# Patient Record
Sex: Male | Born: 1995 | Race: Black or African American | Hispanic: No | Marital: Single | Smoking: Current every day smoker
Health system: Southern US, Community
[De-identification: ages and names within clinical notes are randomized; demographics above are authoritative.]

## PROBLEM LIST (undated history)

## (undated) DIAGNOSIS — G43909 Migraine, unspecified, not intractable, without status migrainosus: Secondary | ICD-10-CM

## (undated) HISTORY — PX: FINGER SURGERY: SHX640

---
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
## 2015-12-20 ENCOUNTER — Encounter (HOSPITAL_COMMUNITY): Payer: Self-pay

## 2015-12-20 ENCOUNTER — Emergency Department (HOSPITAL_COMMUNITY): Payer: Self-pay

## 2015-12-20 ENCOUNTER — Emergency Department (HOSPITAL_COMMUNITY)
Admission: EM | Admit: 2015-12-20 | Discharge: 2015-12-20 | Disposition: A | Discharge status: Home / Self Care | Payer: Self-pay | Attending: Emergency Medicine | Admitting: Emergency Medicine

## 2015-12-20 DIAGNOSIS — R109 Unspecified abdominal pain: Secondary | ICD-10-CM | POA: Insufficient documentation

## 2015-12-20 DIAGNOSIS — R69 Illness, unspecified: Secondary | ICD-10-CM

## 2015-12-20 DIAGNOSIS — Z8679 Personal history of other diseases of the circulatory system: Secondary | ICD-10-CM | POA: Insufficient documentation

## 2015-12-20 DIAGNOSIS — F17210 Nicotine dependence, cigarettes, uncomplicated: Secondary | ICD-10-CM | POA: Insufficient documentation

## 2015-12-20 DIAGNOSIS — J111 Influenza due to unidentified influenza virus with other respiratory manifestations: Secondary | ICD-10-CM | POA: Insufficient documentation

## 2015-12-20 HISTORY — DX: Migraine, unspecified, not intractable, without status migrainosus: G43.909

## 2015-12-20 LAB — CBC WITH DIFFERENTIAL/PLATELET
BASOS ABS: 0 10*3/uL (ref 0.0–0.1)
BASOS PCT: 0 %
EOS PCT: 0 %
Eosinophils Absolute: 0 10*3/uL (ref 0.0–0.7)
HCT: 41 % (ref 39.0–52.0)
Hemoglobin: 13.2 g/dL (ref 13.0–17.0)
LYMPHS PCT: 11 %
Lymphs Abs: 0.9 10*3/uL (ref 0.7–4.0)
MCH: 29.7 pg (ref 26.0–34.0)
MCHC: 32.2 g/dL (ref 30.0–36.0)
MCV: 92.3 fL (ref 78.0–100.0)
MONO ABS: 0.7 10*3/uL (ref 0.1–1.0)
Monocytes Relative: 8 %
Neutro Abs: 6.7 10*3/uL (ref 1.7–7.7)
Neutrophils Relative %: 81 %
PLATELETS: 217 10*3/uL (ref 150–400)
RBC: 4.44 MIL/uL (ref 4.22–5.81)
RDW: 12.7 % (ref 11.5–15.5)
WBC: 8.3 10*3/uL (ref 4.0–10.5)

## 2015-12-20 LAB — BASIC METABOLIC PANEL
Anion gap: 10 (ref 5–15)
CALCIUM: 9.2 mg/dL (ref 8.9–10.3)
CO2: 24 mmol/L (ref 22–32)
Chloride: 104 mmol/L (ref 101–111)
Creatinine, Ser: 1.15 mg/dL (ref 0.61–1.24)
GFR calc Af Amer: >60 mL/min (ref >60)
GLUCOSE: 79 mg/dL (ref 65–99)
Potassium: 4.1 mmol/L (ref 3.5–5.1)
Sodium: 138 mmol/L (ref 135–145)

## 2015-12-20 LAB — LIPASE, BLOOD: Lipase: 23 U/L (ref 11–51)

## 2015-12-20 MED ORDER — ACETAMINOPHEN 500 MG PO TABS
1000.0000 mg | ORAL_TABLET | Freq: Once | ORAL | Status: AC
  Administered 2015-12-20: 1000 mg via ORAL
  Filled 2015-12-20: qty 2

## 2015-12-20 MED ORDER — ONDANSETRON HCL 4 MG/2ML IJ SOLN
4.0000 mg | Freq: Once | INTRAMUSCULAR | Status: AC
  Administered 2015-12-20: 4 mg via INTRAVENOUS
  Filled 2015-12-20: qty 2

## 2015-12-20 MED ORDER — SODIUM CHLORIDE 0.9 % IV BOLUS (SEPSIS)
1000.0000 mL | Freq: Once | INTRAVENOUS | Status: AC
  Administered 2015-12-20: 1000 mL via INTRAVENOUS

## 2015-12-20 MED ORDER — KETOROLAC TROMETHAMINE 30 MG/ML IJ SOLN
30.0000 mg | Freq: Once | INTRAMUSCULAR | Status: AC
  Administered 2015-12-20: 30 mg via INTRAVENOUS
  Filled 2015-12-20: qty 1

## 2015-12-20 NOTE — ED Notes (Signed)
Gave pt orange juice and crackers and a small happy meal.

## 2015-12-20 NOTE — ED Notes (Signed)
Patient transported to X-ray 

## 2015-12-20 NOTE — ED Provider Notes (Signed)
CSN: [id number]     Arrival date & time 12/20/15  1451 History   First MD Initiated Contact with Patient 12/20/15 1705     Chief Complaint  Patient presents with  . Cough  . Emesis  . Headache     (Consider location/radiation/quality/duration/timing/severity/associated sxs/prior Treatment) HPI Patient presents with concern of weakness, cough, nausea, vomiting. Symptoms began about 24 hours ago. Since onset symptoms of been persistent, with possibly 3 episodes of vomiting. No diarrhea. There is associated anorexia, weakness, abdominal discomfort, though no focal pain anywhere in the abdomen. Patient complains of diffuse myalgias as well. Patient was well prior to the onset of symptoms, denies medical problems, chronic medical conditions. Patient does acknowledge smoking cigarettes. Since onset, no relief with OTC medication, and the patient vomited his most recent attempt to take ibuprofen.  Smoking cessation provided, particularly in light of this patient's evaluation in the ED.   Past Medical History  Diagnosis Date  . Migraine    History reviewed. No pertinent past surgical history. History reviewed. No pertinent family history. Social History  Substance Use Topics  . Smoking status: Current Every Day Smoker -- 0.50 packs/day    Types: Cigarettes  . Smokeless tobacco: None  . Alcohol Use: No    Review of Systems  Constitutional:       Per HPI, otherwise negative  HENT:       Per HPI, otherwise negative  Respiratory:       Per HPI, otherwise negative  Cardiovascular:       Per HPI, otherwise negative  Gastrointestinal: Positive for vomiting.  Endocrine:       Negative aside from HPI  Genitourinary:       Neg aside from HPI   Musculoskeletal:       Per HPI, otherwise negative  Skin: Negative.   Neurological: Negative for syncope.      Allergies  Review of patient's allergies indicates no known allergies.  Home Medications   Prior to Admission  medications   Medication Sig Start Date End Date Taking? Authorizing Provider  ibuprofen (ADVIL,MOTRIN) 200 MG tablet Take 1,600 mg by mouth once.   Yes Historical Provider, MD  OVER THE COUNTER MEDICATION Take 0.5 Doses by mouth once. mucinex flu   Yes Historical Provider, MD  diphenhydrAMINE (BENADRYL) 25 MG tablet Take 1 tablet (25 mg total) by mouth every 4 (four) hours. Patient not taking: Reported on 12/20/2015 08/07/12   Margarita Mail, PA-C  hydrocortisone cream 1 % Apply to affected area every 4  Hours Patient not taking: Reported on 12/20/2015 08/07/12   Margarita Mail, PA-C   BP 108/51 mmHg  Pulse 58  Temp(Src) 100 F (37.8 C) (Oral)  Resp 18  Ht 6' (1.829 m)  Wt 175 lb 4.8 oz (79.516 kg)  BMI 23.77 kg/m2  SpO2 98% Physical Exam  Constitutional: He is oriented to person, place, and time. He appears well-developed. No distress.  HENT:  Head: Normocephalic and atraumatic.  Eyes: Conjunctivae and EOM are normal.  Cardiovascular: Normal rate and regular rhythm.   Pulmonary/Chest: Effort normal. No stridor. No respiratory distress.  Abdominal: He exhibits no distension. There is no tenderness.  Musculoskeletal: He exhibits no edema.  Neurological: He is alert and oriented to person, place, and time.  Skin: Skin is warm and dry.  Psychiatric: He has a normal mood and affect.  Nursing note and vitals reviewed.   ED Course  Procedures (including critical care time) Labs Review Labs Reviewed  BASIC METABOLIC  PANEL - Abnormal; Notable for the following:    BUN <5 (*)    All other components within normal limits  CBC WITH DIFFERENTIAL/PLATELET  LIPASE, BLOOD    Imaging Review Dg Chest 2 View  12/20/2015  CLINICAL DATA:  Cough and fever for 1 day EXAM: CHEST  2 VIEW COMPARISON:  None. FINDINGS: The heart size and mediastinal contours are within normal limits. Both lungs are clear. The visualized skeletal structures are unremarkable. IMPRESSION: No active cardiopulmonary  disease. Electronically Signed   By: Inez Catalina M.D.   On: 12/20/2015 18:45   I have personally reviewed and evaluated these images and lab results as part of my medical decision-making.  On repeat exam the patient is in no distress, states that he feels better. We discussed all findings, likelihood of influenza-like illness.   MDM   Final diagnoses:  Influenza-like illness   Well-appearing young male presents with generalized illness, cough, congestion. Here, the patient is awake and alert, with no evidence for bacteremia, sepsis. No evidence for pneumonia. Patient improved here with fluids, vacation. Patient's presentation consistent with influenza-like illness, no indication for admission, and with his improvement, he was discharged in stable condition.  Carmin Muskrat, MD 12/21/15 [phone]

## 2015-12-20 NOTE — Discharge Instructions (Signed)
Influenza-like illnesses Influenza ("the flu") is a viral infection of the respiratory tract. It occurs more often in winter months because people spend more time in close contact with one another. Influenza can make you feel very sick. Influenza easily spreads from person to person (contagious). CAUSES  Influenza is caused by a virus that infects the respiratory tract. You can catch the virus by breathing in droplets from an infected person's cough or sneeze. You can also catch the virus by touching something that was recently contaminated with the virus and then touching your mouth, nose, or eyes. RISKS AND COMPLICATIONS You may be at risk for a more severe case of influenza if you smoke cigarettes, have diabetes, have chronic heart disease (such as heart failure) or lung disease (such as asthma), or if you have a weakened immune system. Elderly people and pregnant women are also at risk for more serious infections. The most common problem of influenza is a lung infection (pneumonia). Sometimes, this problem can require emergency medical care and may be life threatening. SIGNS AND SYMPTOMS  Symptoms typically last 4 to 10 days and may include:  Fever.  Chills.  Headache, body aches, and muscle aches.  Sore throat.  Chest discomfort and cough.  Poor appetite.  Weakness or feeling tired.  Dizziness.  Nausea or vomiting. DIAGNOSIS  Diagnosis of influenza is often made based on your history and a physical exam. A nose or throat swab test can be done to confirm the diagnosis. TREATMENT  In mild cases, influenza goes away on its own. Treatment is directed at relieving symptoms. For more severe cases, your health care provider may prescribe antiviral medicines to shorten the sickness. Antibiotic medicines are not effective because the infection is caused by a virus, not by bacteria. HOME CARE INSTRUCTIONS  Take medicines only as directed by your health care provider.  Use a cool mist  humidifier to make breathing easier.  Get plenty of rest until your temperature returns to normal. This usually takes 3 to 4 days.  Drink enough fluid to keep your urine clear or pale yellow.  Cover yourmouth and nosewhen coughing or sneezing,and wash your handswellto prevent thevirusfrom spreading.  Stay homefromwork orschool untilthe fever is gonefor at least 64full day. PREVENTION  An annual influenza vaccination (flu shot) is the best way to avoid getting influenza. An annual flu shot is now routinely recommended for all adults in the [place] IF:  You experiencechest pain, yourcough worsens,or you producemore mucus.  Youhave nausea,vomiting, ordiarrhea.  Your fever returns or gets worse. SEEK IMMEDIATE MEDICAL CARE IF:  You havetrouble breathing, you become short of breath,or your skin ornails becomebluish.  You have severe painor stiffnessin the neck.  You develop a sudden headache, or pain in the face or ear.  You have nausea or vomiting that you cannot control. MAKE SURE YOU:   Understand these instructions.  Will watch your condition.  Will get help right away if you are not doing well or get worse.   This information is not intended to replace advice given to you by your health care provider. Make sure you discuss any questions you have with your health care provider.   Document Released: 09/09/2000 Document Revised: 10/03/2014 Document Reviewed: 12/12/2011 Elsevier Interactive Patient Education [employer].

## 2015-12-20 NOTE — ED Notes (Signed)
Onset last night headache, woke up this morning with body aches, vomiting x 3 and cough.  No respiratory difficulties, talking in complete sentences.  Took Ibu 800 x 2 @ 1:30pm.

## 2017-09-05 IMAGING — CR DG CHEST 2V
2 series · 2 of 2 positions shown · non-contrast
Comparison: None.

CLINICAL DATA: Cough and fever for 1 day

EXAM:
CHEST  2 VIEW

[chest pa]
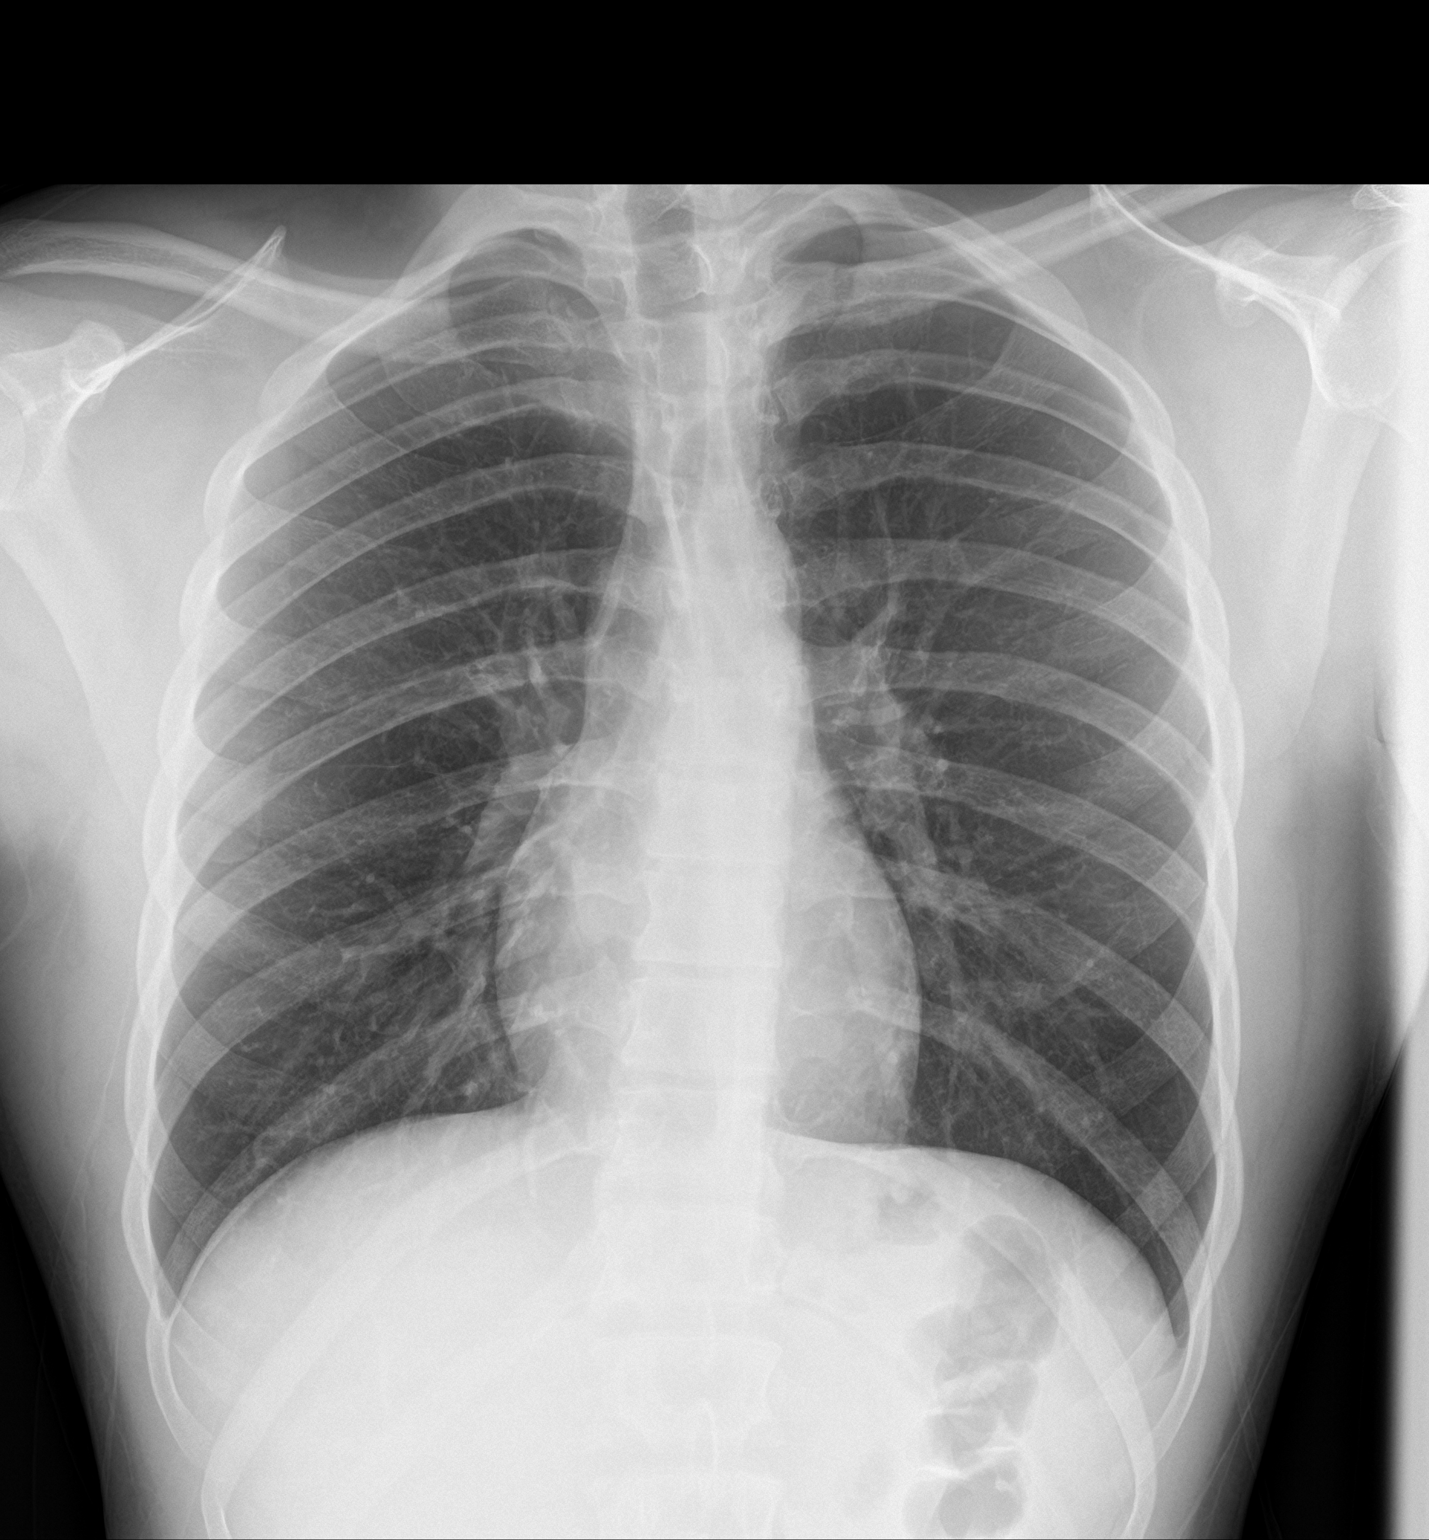

[chest lat]
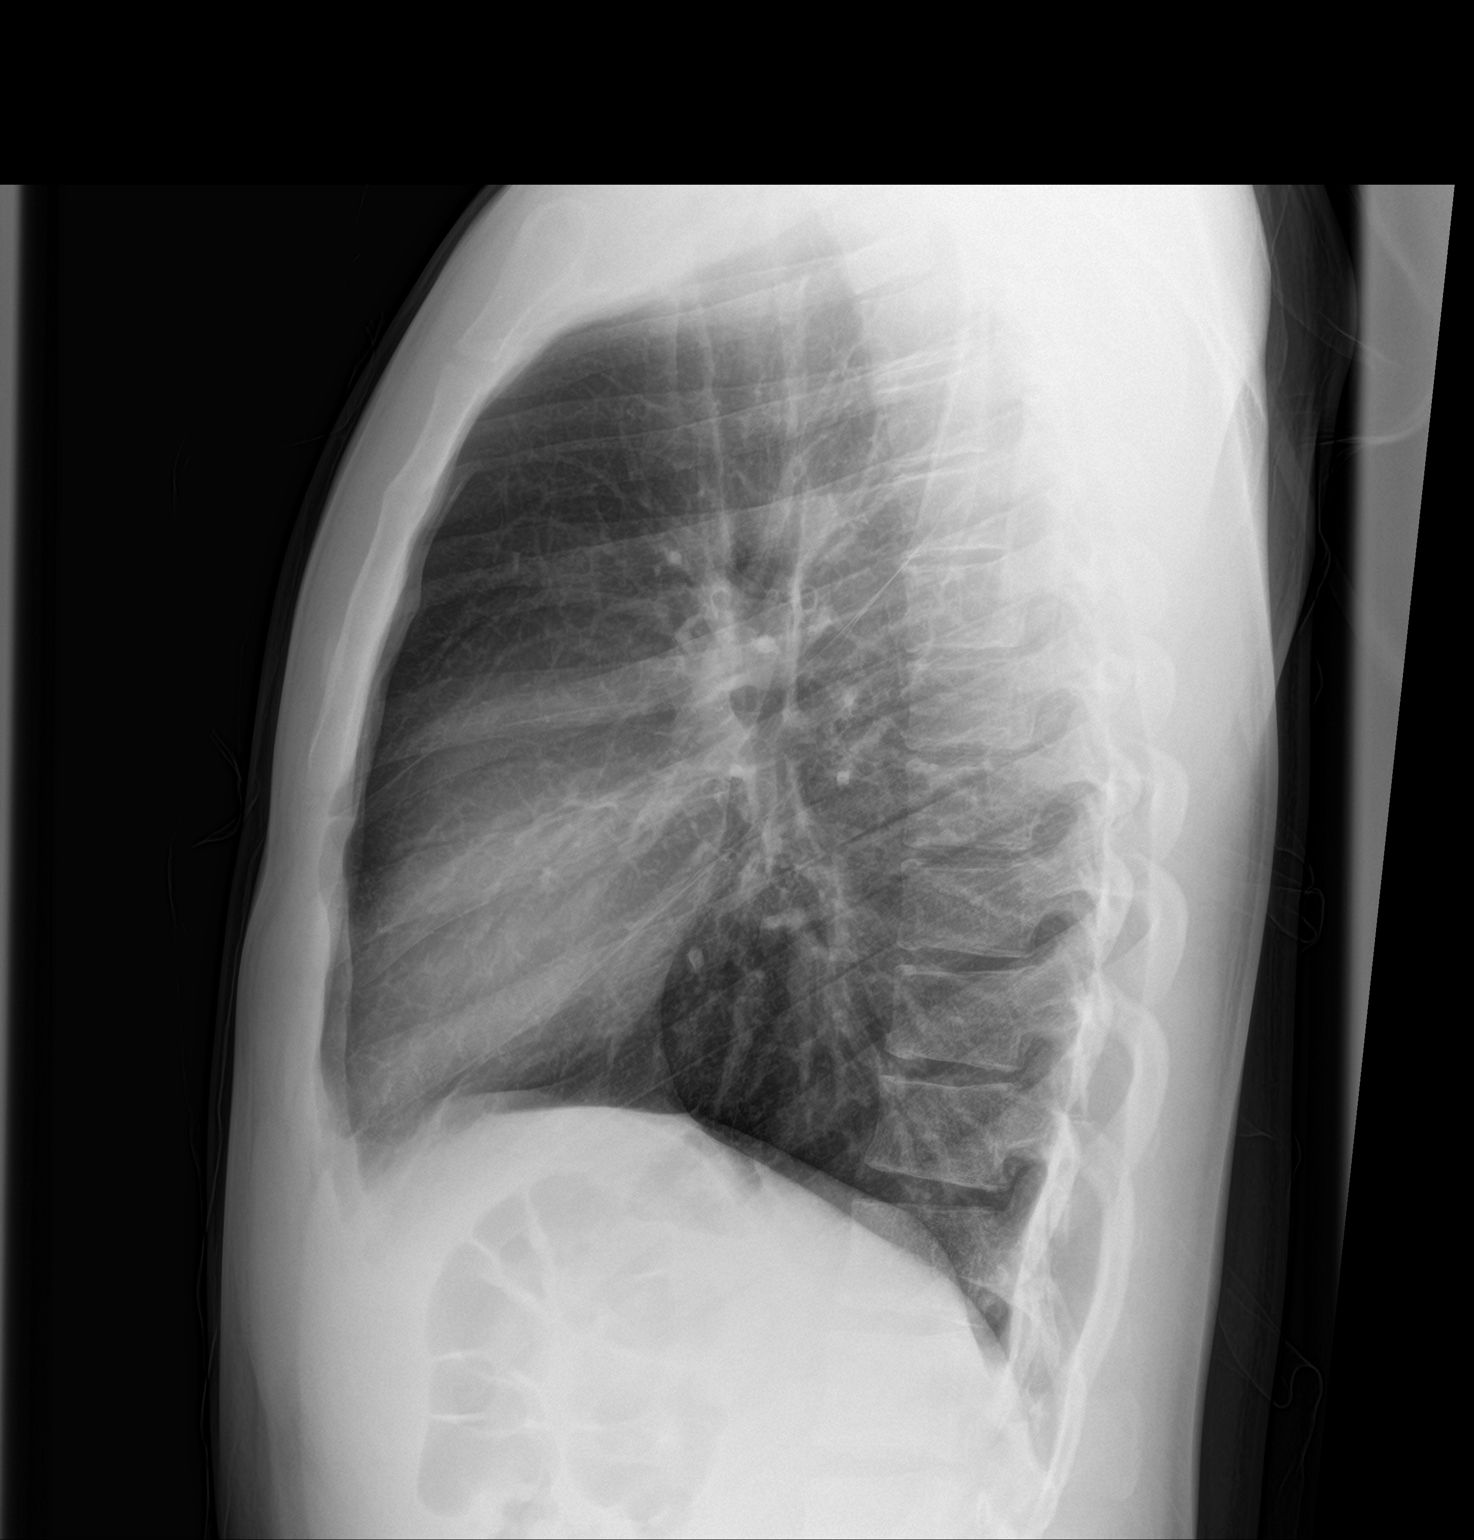

[2 of 2 positions shown; findings below may reference images not displayed]

FINDINGS: The heart size and mediastinal contours are within normal limits.
Both lungs are clear. The visualized skeletal structures are
unremarkable.
IMPRESSION: No active cardiopulmonary disease.

## 2022-02-22 ENCOUNTER — Other Ambulatory Visit: Payer: Self-pay

## 2022-02-22 ENCOUNTER — Emergency Department (HOSPITAL_COMMUNITY)
Admission: EM | Admit: 2022-02-22 | Discharge: 2022-02-22 | Disposition: A | Discharge status: Home / Self Care | Payer: Self-pay | Attending: Emergency Medicine | Admitting: Emergency Medicine

## 2022-02-22 ENCOUNTER — Encounter (HOSPITAL_COMMUNITY): Payer: Self-pay

## 2022-02-22 DIAGNOSIS — Z202 Contact with and (suspected) exposure to infections with a predominantly sexual mode of transmission: Secondary | ICD-10-CM | POA: Insufficient documentation

## 2022-02-22 DIAGNOSIS — R030 Elevated blood-pressure reading, without diagnosis of hypertension: Secondary | ICD-10-CM | POA: Insufficient documentation

## 2022-02-22 MED ORDER — METRONIDAZOLE 500 MG PO TABS: 500.0000 mg | ORAL_TABLET | Freq: Two times a day (BID) | ORAL | 0 refills | Status: AC

## 2022-02-22 NOTE — Discharge Instructions (Signed)
It was our pleasure to provide your ER care today - we hope that you feel better.  See 'safer sex' info provided.   Take antibiotic as prescribed.  Follow up with [facility] if any other concern.   Your blood pressure is high today - follow up with primary care doctor in the next couple weeks for recheck.

## 2022-02-22 NOTE — ED Triage Notes (Signed)
Patient reports that his sexual partner told him that she was positive for trichomonas.

## 2022-02-22 NOTE — ED Provider Notes (Signed)
[facility] DEPT Provider Note   CSN: [id number] Arrival date & time: 02/22/22  1707     History  Chief Complaint  Patient presents with   SEXUALLY TRANSMITTED DISEASE    Anthony Hooper is a 26 y.o. male.  Patient indicates a sexual contact told him she tested positive for trich and he wants tx for that. Pt denies any symptoms. No penile discharge. No dysuria. No skin or penile ulcers, lesions or rash. No fevers.   The history is provided by the patient and medical records.      Home Medications Prior to Admission medications   Medication Sig Start Date End Date Taking? Authorizing Provider  diphenhydrAMINE (BENADRYL) 25 MG tablet Take 1 tablet (25 mg total) by mouth every 4 (four) hours. Patient not taking: Reported on 12/20/2015 08/07/12   Margarita Mail, PA-C  hydrocortisone cream 1 % Apply to affected area every 4  Hours Patient not taking: Reported on 12/20/2015 08/07/12   Margarita Mail, PA-C  ibuprofen (ADVIL,MOTRIN) 200 MG tablet Take 1,600 mg by mouth once.    [provider]  OVER THE COUNTER MEDICATION Take 0.5 Doses by mouth once. mucinex flu    [provider]      Allergies    Patient has no known allergies.    Review of Systems   Review of Systems  Constitutional:  Negative for fever.  HENT:  Negative for sore throat.   Gastrointestinal:  Negative for abdominal pain.  Genitourinary:  Negative for dysuria, penile discharge and penile pain.  Musculoskeletal:  Negative for arthralgias.  Skin:  Negative for rash and wound.   Physical Exam Updated Vital Signs BP (!) 149/77 (BP Location: Left Arm)   Pulse 74   Temp 98.1 F (36.7 C) (Oral)   Resp 18   Ht 1.829 m (6')   Wt 90.7 kg   SpO2 100%   BMI 27.12 kg/m  Physical Exam Vitals and nursing note reviewed.  Constitutional:      Appearance: Normal appearance. He is well-developed.  HENT:     Head: Atraumatic.     Nose: Nose normal.      Mouth/Throat:     Mouth: Mucous membranes are moist.  Eyes:     Conjunctiva/sclera: Conjunctivae normal.  Neck:     Trachea: No tracheal deviation.  Cardiovascular:     Rate and Rhythm: Normal rate.     Heart sounds:    No gallop.  Pulmonary:     Effort: Pulmonary effort is normal. No accessory muscle usage or respiratory distress.  Abdominal:     General: There is no distension.     Tenderness: There is no abdominal tenderness.  Genitourinary:    Comments: No penile d/c.  Musculoskeletal:        General: No swelling.     Cervical back: Neck supple. No rigidity.  Skin:    General: Skin is warm and dry.     Findings: No rash.  Neurological:     Mental Status: He is alert.     Comments: Alert, speech clear.   Psychiatric:        Mood and Affect: Mood normal.    ED Results / Procedures / Treatments   Labs (all labs ordered are listed, but only abnormal results are displayed) Labs Reviewed - No data to display  EKG None  Radiology No results found.  Procedures Procedures    Medications Ordered in ED Medications - No data to display  ED Course/ Medical Decision Making/ A&P                           Medical Decision Making Problems Addressed: Elevated blood pressure reading: acute illness or injury that poses a threat to life or bodily functions Possible exposure to STD: acute illness or injury  Risk Prescription drug management.   Pt denies any current symptoms, and is requesting rx for trichomonas.   Confirmed nkda w pt.   Rx provided.   Also discussed elevated bp and need for outpatient f/u.  Return precautions provided.         Final Clinical Impression(s) / ED Diagnoses Final diagnoses:  None    Rx / DC Orders ED Discharge Orders     None         Lajean Saver, MD 02/22/22 1747

## 2023-05-10 ENCOUNTER — Other Ambulatory Visit: Payer: Self-pay

## 2023-05-10 ENCOUNTER — Emergency Department (HOSPITAL_BASED_OUTPATIENT_CLINIC_OR_DEPARTMENT_OTHER): Payer: BC Managed Care – PPO

## 2023-05-10 ENCOUNTER — Encounter (HOSPITAL_BASED_OUTPATIENT_CLINIC_OR_DEPARTMENT_OTHER): Payer: Self-pay | Admitting: Emergency Medicine

## 2023-05-10 ENCOUNTER — Emergency Department (HOSPITAL_BASED_OUTPATIENT_CLINIC_OR_DEPARTMENT_OTHER)
Admission: EM | Admit: 2023-05-10 | Discharge: 2023-05-10 | Disposition: A | Discharge status: Home / Self Care | Payer: BC Managed Care – PPO | Attending: Emergency Medicine | Admitting: Emergency Medicine

## 2023-05-10 DIAGNOSIS — R519 Headache, unspecified: Secondary | ICD-10-CM | POA: Diagnosis present

## 2023-05-10 DIAGNOSIS — Z8249 Family history of ischemic heart disease and other diseases of the circulatory system: Secondary | ICD-10-CM

## 2023-05-10 LAB — CBC
HCT: 43.2 % (ref 39.0–52.0)
Hemoglobin: 14.8 g/dL (ref 13.0–17.0)
MCH: 31.2 pg (ref 26.0–34.0)
MCHC: 34.3 g/dL (ref 30.0–36.0)
MCV: 90.9 fL (ref 80.0–100.0)
Platelets: 302 10*3/uL (ref 150–400)
RBC: 4.75 MIL/uL (ref 4.22–5.81)
RDW: 12.8 % (ref 11.5–15.5)
WBC: 8.5 10*3/uL (ref 4.0–10.5)
nRBC: 0 % (ref 0.0–0.2)

## 2023-05-10 LAB — BASIC METABOLIC PANEL
Anion gap: 9 (ref 5–15)
BUN: 12 mg/dL (ref 6–20)
CO2: 25 mmol/L (ref 22–32)
Calcium: 9.1 mg/dL (ref 8.9–10.3)
Chloride: 103 mmol/L (ref 98–111)
Creatinine, Ser: 1.13 mg/dL (ref 0.61–1.24)
GFR, Estimated: >60 mL/min (ref >60)
Glucose, Bld: 78 mg/dL (ref 70–99)
Potassium: 3.8 mmol/L (ref 3.5–5.1)
Sodium: 137 mmol/L (ref 135–145)

## 2023-05-10 MED ORDER — SODIUM CHLORIDE 0.9 % IV BOLUS
500.0000 mL | Freq: Once | INTRAVENOUS | Status: AC
  Administered 2023-05-10: 500 mL via INTRAVENOUS

## 2023-05-10 MED ORDER — IOHEXOL 350 MG/ML SOLN
75.0000 mL | Freq: Once | INTRAVENOUS | Status: AC | PRN
  Administered 2023-05-10: 75 mL via INTRAVENOUS

## 2023-05-10 MED ORDER — PROCHLORPERAZINE EDISYLATE 10 MG/2ML IJ SOLN
10.0000 mg | Freq: Once | INTRAMUSCULAR | Status: AC
  Administered 2023-05-10: 10 mg via INTRAVENOUS
  Filled 2023-05-10: qty 2

## 2023-05-10 MED ORDER — DIPHENHYDRAMINE HCL 50 MG/ML IJ SOLN
12.5000 mg | Freq: Once | INTRAMUSCULAR | Status: AC
  Administered 2023-05-10: 12.5 mg via INTRAVENOUS
  Filled 2023-05-10: qty 1

## 2023-05-10 MED ORDER — KETOROLAC TROMETHAMINE 30 MG/ML IJ SOLN
30.0000 mg | Freq: Once | INTRAMUSCULAR | Status: AC
  Administered 2023-05-10: 30 mg via INTRAVENOUS
  Filled 2023-05-10: qty 1

## 2023-05-10 NOTE — ED Provider Notes (Signed)
[facility] Provider Note   CSN: [id number] Arrival date & time: 05/10/23  1504     History  Chief Complaint  Patient presents with   Headache    Anthony Hooper is a 27 y.o. male with history of migraines, not on preventative medications, who presents to the emergency department complaining of a headache.  Patient states that he has had a headache for the past 5 to 6 days, mainly with pain behind both of his eyes.  While he states that this feels like his previous headaches, this has lasted much longer.  Normally he can take some ibuprofen and his headache resolved the following day.  Having some mild photophobia, no blurry vision.  Denies fever, cough, congestion, neck stiffness, vomiting.  No known sick contacts.  He does endorse a significant family history of brain aneurysms, which his mother required surgery for.   Headache Associated symptoms: photophobia        Home Medications Prior to Admission medications   Medication Sig Start Date End Date Taking? Authorizing Provider  diphenhydrAMINE (BENADRYL) 25 MG tablet Take 1 tablet (25 mg total) by mouth every 4 (four) hours. Patient not taking: Reported on 12/20/2015 08/07/12   Arthor Captain, PA-C  hydrocortisone cream 1 % Apply to affected area every 4  Hours Patient not taking: Reported on 12/20/2015 08/07/12   Arthor Captain, PA-C  ibuprofen (ADVIL,MOTRIN) 200 MG tablet Take 1,600 mg by mouth once.    [provider]  metroNIDAZOLE (FLAGYL) 500 MG tablet Take 1 tablet (500 mg total) by mouth 2 (two) times daily. Do not drink alcohol when taking this medication 02/22/22   Cathren Laine, MD  OVER THE COUNTER MEDICATION Take 0.5 Doses by mouth once. mucinex flu    [provider]      Allergies    Patient has no known allergies.    Review of Systems   Review of Systems  Eyes:  Positive for photophobia.  Neurological:  Positive for headaches.  All other  systems reviewed and are negative.   Physical Exam Updated Vital Signs BP 123/80 (BP Location: Right Arm)   Pulse 92   Temp 97.8 F (36.6 C)   Resp 18   Ht 6' (1.829 m)   Wt 95.3 kg   SpO2 96%   BMI 28.48 kg/m  Physical Exam Vitals and nursing note reviewed.  Constitutional:      Appearance: Normal appearance.  HENT:     Head: Normocephalic and atraumatic.  Eyes:     General: Lids are normal.     Extraocular Movements: Extraocular movements intact.     Conjunctiva/sclera: Conjunctivae normal.     Pupils: Pupils are equal, round, and reactive to light.  Neck:     Meningeal: Brudzinski's sign and Kernig's sign absent.  Pulmonary:     Effort: Pulmonary effort is normal. No respiratory distress.  Musculoskeletal:     Cervical back: Full passive range of motion without pain and normal range of motion. No rigidity. No spinous process tenderness or muscular tenderness.  Skin:    General: Skin is warm and dry.  Neurological:     Mental Status: He is alert.     Comments: Neuro: Speech is clear, able to follow commands. CN III-XII intact grossly intact. PERRLA. EOMI. Sensation intact throughout. Str 5/5 all extremities.  Psychiatric:        Mood and Affect: Mood normal.        Behavior: Behavior  normal.     ED Results / Procedures / Treatments   Labs (all labs ordered are listed, but only abnormal results are displayed) Labs Reviewed  CBC  BASIC METABOLIC PANEL    EKG None  Radiology CT Angio Head Neck W WO CM  Result Date: 05/10/2023 CLINICAL DATA:  Initial evaluation for acute headache. EXAM: CT ANGIOGRAPHY HEAD AND NECK WITH AND WITHOUT CONTRAST TECHNIQUE: Multidetector CT imaging of the head and neck was performed using the standard protocol during bolus administration of intravenous contrast. Multiplanar CT image reconstructions and MIPs were obtained to evaluate the vascular anatomy. Carotid stenosis measurements (when applicable) are obtained utilizing NASCET  criteria, using the distal internal carotid diameter as the denominator. RADIATION DOSE REDUCTION: This exam was performed according to the departmental dose-optimization program which includes automated exposure control, adjustment of the mA and/or kV according to patient size and/or use of iterative reconstruction technique. CONTRAST:  75mL OMNIPAQUE IOHEXOL 350 MG/ML SOLN COMPARISON:  None Available. FINDINGS: CT HEAD FINDINGS Brain: Cerebral volume within normal limits for patient age. No evidence for acute intracranial hemorrhage. No findings to suggest acute large vessel territory infarct. No mass lesion, midline shift, or mass effect. Ventricles are normal in size without evidence for hydrocephalus. No extra-axial fluid collection identified. Vascular: No hyperdense vessel identified. Skull: Scalp soft tissues demonstrate no acute abnormality. Calvarium intact. Sinuses/Orbits: Globes and orbital soft tissues within normal limits. Few scattered retention cyst noted about the maxillary sinuses. Paranasal sinuses are otherwise clear. No mastoid effusion. CTA NECK FINDINGS Aortic arch: Standard branching. Imaged portion shows no evidence of aneurysm or dissection. No significant stenosis of the major arch vessel origins. Right carotid system: No evidence of dissection, stenosis (50% or greater), or occlusion. Left carotid system: No evidence of dissection, stenosis (50% or greater), or occlusion. Vertebral arteries: Codominant. No evidence of dissection, stenosis (50% or greater), or occlusion. Skeleton: Negative. Other neck: Negative. Upper chest: Negative. Review of the MIP images confirms the above findings CTA HEAD FINDINGS Anterior circulation: Both internal carotid arteries patent to the termini without stenosis. 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 No convincing aneurysm. A1 segments  patent bilaterally. Anterior communicating artery complex within normal limits. Both ACAs patent without stenosis. No M1 stenosis or occlusion. Normal MCA bifurcations. Distal MCA branches perfused and symmetric. Posterior circulation: Both V4 segments patent without stenosis. Left PICA patent. Right PICA origin not well seen. Basilar patent without stenosis. Superior cerebellar and posterior cerebral arteries patent bilaterally. Venous sinuses: Patent allowing for timing the contrast bolus. Anatomic variants: None significant. No intracranial aneurysm or other vascular malformation. Review of the MIP images confirms the above findings IMPRESSION: 1. Normal CTA of the head and neck. No large vessel occlusion, hemodynamically significant stenosis, or other acute vascular abnormality. No aneurysm. 2. No other acute intracranial abnormality. Electronically Signed   By: Rise Mu M.D.   On: 05/10/2023 19:16    Procedures Procedures    Medications Ordered in ED Medications  ketorolac (TORADOL) 30 MG/ML injection 30 mg (30 mg Intravenous Given 05/10/23 1741)  sodium chloride 0.9 % bolus 500 mL (0 mLs Intravenous Stopped 05/10/23 1814)  diphenhydrAMINE (BENADRYL) injection 12.5 mg (12.5 mg Intravenous Given 05/10/23 1742)  prochlorperazine (COMPAZINE) injection 10 mg (10 mg Intravenous Given 05/10/23 1741)  iohexol (OMNIPAQUE) 350 MG/ML injection 75 mL (75 mLs Intravenous Contrast Given 05/10/23 1838)    ED Course/ Medical Decision  Making/ A&P                                 Medical Decision Making Amount and/or Complexity of Data Reviewed Labs: ordered. Radiology: ordered.  Risk Prescription drug management.   This patient is a 27 y.o. male  who presents to the ED for concern of headache.   Differential diagnoses prior to evaluation: The emergent differential diagnosis includes, but is not limited to,  Stroke, increased ICP, meningitis, CVA, intracranial tumor, venous sinus thrombosis,  migraine, cluster headache, hypertension, drug related, head injury, tension headache, sinusitis, dental abscess, otitis media, TMJ. This is not an exhaustive differential.   Past Medical History / Co-morbidities / Social History: migraines  Physical Exam: Physical exam performed. The pertinent findings include: Normal vital signs, no acute distress.  Normal neurologic exam as above.  Lab Tests/Imaging studies: I personally interpreted labs/imaging and the pertinent results include: CBC and BMP unremarkable.  CT angio head neck unremarkable. I agree with the radiologist interpretation.  Medications: I ordered medication including migraine cocktail including IV fluids, Benadryl, Toradol, Compazine.  I have reviewed the patients home medicines and have made adjustments as needed.  Upon reevaluation patient states his symptoms have resolved and he is ready to go home.   Disposition: After consideration of the diagnostic results and the patients response to treatment, I feel that emergency department workup does not suggest an emergent condition requiring admission or immediate intervention beyond what has been performed at this time. The plan is: Discharge to home with symptomatic management after likely migraine headache.  Encouraged follow-up with PCP and/or neurology, given information for [facility] community health and wellness.  Imaging was unremarkable.. The patient is safe for discharge and has been instructed to return immediately for worsening symptoms, change in symptoms or any other concerns.  Final Clinical Impression(s) / ED Diagnoses Final diagnoses:  Bad headache  Family history of brain aneurysm    Rx / DC Orders ED Discharge Orders     None      Portions of this report may have been transcribed using voice recognition software. Every effort was made to ensure accuracy; however, inadvertent computerized transcription errors may be present.    Jeanella Flattery 05/10/23 2344    Alvira Monday, MD 05/11/23 1404

## 2023-05-10 NOTE — ED Notes (Signed)
Discharge paperwork reviewed entirely with patient, including follow up care. Pain was under control. No prescriptions were called in, but all questions were addressed.  Pt verbalized understanding as well as all parties involved. No questions or concerns voiced at the time of discharge. No acute distress noted.   Pt ambulated out to PVA without incident or assistance.  

## 2023-05-10 NOTE — ED Triage Notes (Signed)
Pt endorses headache for 5-6 days, pain behind eyes. Hx of headaches. No relief with goody powders and ibuprofen.  Denies fevers, cough or sick contacts.

## 2023-05-10 NOTE — Discharge Instructions (Addendum)
You were seen in the emergency department today for headache.  As we discussed your head imaging did not show any evidence of a brain aneurysm.  We gave you medication for a migraine, and I am glad this helped.  I recommend continue to take ibuprofen and/or Tylenol as needed for headaches.  I recommend following up with a primary doctor regarding these headaches.  It would benefit you to discuss possible preventative medications. Continue to monitor how you're doing and return to the ER for new or worsening symptoms.

## 2024-04-05 ENCOUNTER — Encounter (HOSPITAL_COMMUNITY): Payer: Self-pay | Admitting: Emergency Medicine

## 2024-04-05 ENCOUNTER — Ambulatory Visit (HOSPITAL_COMMUNITY)
Admission: EM | Admit: 2024-04-05 | Discharge: 2024-04-05 | Disposition: A | Discharge status: Home / Self Care | Payer: Self-pay | Attending: Physician Assistant | Admitting: Physician Assistant

## 2024-04-05 DIAGNOSIS — R220 Localized swelling, mass and lump, head: Secondary | ICD-10-CM

## 2024-04-05 MED ORDER — AMOXICILLIN 500 MG PO CAPS: 500.0000 mg | ORAL_CAPSULE | Freq: Two times a day (BID) | ORAL | 0 refills | Status: AC

## 2024-04-05 NOTE — ED Triage Notes (Signed)
 Pt c/o gum being swollen above upper front 2 teeth x's 2 days  Also st's he had some bleeding from gum this am

## 2024-04-05 NOTE — Discharge Instructions (Signed)
  Please keep appointment with dentist on Monday.

## 2024-04-05 NOTE — ED Provider Notes (Signed)
 [facility]    CSN: [id number] Arrival date & time: 04/05/24  9062      History   Chief Complaint Chief Complaint  Patient presents with   Gum swollen    HPI Anthony Hooper is a 28 y.o. male.   Patient here today for evaluation of swelling of his gums of his 2 front upper teeth this been present for 2 days.  He reports that he thinks he might of injured himself while flossing and now he has an infection.  He does have appointment with dentist in 3 days.  He denies any fever or other symptoms.  He did note some bleeding from his gums this morning.   The history is provided by the patient.    Past Medical History:  Diagnosis Date   Migraine     There are no active problems to display for this patient.   Past Surgical History:  Procedure Laterality Date   FINGER SURGERY         Home Medications    Prior to Admission medications   Medication Sig Start Date End Date Taking? Authorizing Provider  amoxicillin  (AMOXIL ) 500 MG capsule Take 1 capsule (500 mg total) by mouth 2 (two) times daily for 7 days. 04/05/24 04/12/24 Yes Billy Asberry FALCON, PA-C  diphenhydrAMINE  (BENADRYL ) 25 MG tablet Take 1 tablet (25 mg total) by mouth every 4 (four) hours. Patient not taking: Reported on 12/20/2015 08/07/12   Harris, Abigail, PA-C  hydrocortisone  cream 1 % Apply to affected area every 4  Hours Patient not taking: Reported on 12/20/2015 08/07/12   Harris, Abigail, PA-C  ibuprofen (ADVIL,MOTRIN) 200 MG tablet Take 1,600 mg by mouth once.    [provider]  metroNIDAZOLE  (FLAGYL ) 500 MG tablet Take 1 tablet (500 mg total) by mouth 2 (two) times daily. Do not drink alcohol when taking this medication Patient not taking: Reported on 04/05/2024 02/22/22   Steinl, Kevin, MD  OVER THE COUNTER MEDICATION Take 0.5 Doses by mouth once. mucinex flu    [provider]    Family History Family History  Family history unknown: Yes    Social History Social History    Tobacco Use   Smoking status: Every Day    Current packs/day: 0.50    Types: Cigarettes  Vaping Use   Vaping status: Never Used  Substance Use Topics   Alcohol use: Yes   Drug use: No     Allergies   Patient has no known allergies.   Review of Systems Review of Systems  Constitutional:  Negative for chills and fever.  HENT:  Positive for dental problem.   Eyes:  Negative for discharge and redness.  Respiratory:  Negative for shortness of breath.   Gastrointestinal:  Negative for nausea and vomiting.  Neurological:  Negative for numbness.     Physical Exam Triage Vital Signs ED Triage Vitals  Encounter Vitals Group     BP 04/05/24 0957 126/87     Girls Systolic BP Percentile --      Girls Diastolic BP Percentile --      Boys Systolic BP Percentile --      Boys Diastolic BP Percentile --      Pulse Rate 04/05/24 0957 70     Resp 04/05/24 0957 16     Temp 04/05/24 0957 98 F (36.7 C)     Temp Source 04/05/24 0957 Oral     SpO2 04/05/24 0957 97 %     Weight --  Height --      Head Circumference --      Peak Flow --      Pain Score 04/05/24 0958 6     Pain Loc --      Pain Education --      Exclude from Growth Chart --    No data found.  Updated Vital Signs BP 126/87 (BP Location: Left Arm)   Pulse 70   Temp 98 F (36.7 C) (Oral)   Resp 16   SpO2 97%   Visual Acuity Right Eye Distance:   Left Eye Distance:   Bilateral Distance:    Right Eye Near:   Left Eye Near:    Bilateral Near:     Physical Exam Vitals and nursing note reviewed.  Constitutional:      General: He is not in acute distress.    Appearance: Normal appearance. He is not ill-appearing.  HENT:     Head: Normocephalic and atraumatic.     Mouth/Throat:     Comments: Gingival swelling and erythema appreciated to upper incisor area Eyes:     Conjunctiva/sclera: Conjunctivae normal.  Cardiovascular:     Rate and Rhythm: Normal rate.  Pulmonary:     Effort: Pulmonary effort  is normal. No respiratory distress.  Neurological:     Mental Status: He is alert.  Psychiatric:        Mood and Affect: Mood normal.        Behavior: Behavior normal.        Thought Content: Thought content normal.      UC Treatments / Results  Labs (all labs ordered are listed, but only abnormal results are displayed) Labs Reviewed - No data to display  EKG   Radiology No results found.  Procedures Procedures (including critical care time)  Medications Ordered in UC Medications - No data to display  Initial Impression / Assessment and Plan / UC Course  I have reviewed the triage vital signs and the nursing notes.  Pertinent labs & imaging results that were available during my care of the patient were reviewed by me and considered in my medical decision making (see chart for details).    Will treat to cover possible infection with amoxicillin .  Advise follow-up if no gradual improvement or with any further concerns.  Discussed that he should keep appointment with dentist on Monday.  Final Clinical Impressions(s) / UC Diagnoses   Final diagnoses:  Gingival swelling     Discharge Instructions       Please keep appointment with dentist on Monday.        ED Prescriptions     Medication Sig Dispense Auth. Provider   amoxicillin  (AMOXIL ) 500 MG capsule Take 1 capsule (500 mg total) by mouth 2 (two) times daily for 7 days. 14 capsule Billy Asberry FALCON, PA-C      PDMP not reviewed this encounter.   Billy Asberry FALCON, PA-C 04/05/24 1416

## 2024-06-10 ENCOUNTER — Encounter (HOSPITAL_BASED_OUTPATIENT_CLINIC_OR_DEPARTMENT_OTHER): Payer: Self-pay

## 2024-06-10 ENCOUNTER — Emergency Department (HOSPITAL_BASED_OUTPATIENT_CLINIC_OR_DEPARTMENT_OTHER): Payer: Self-pay

## 2024-06-10 ENCOUNTER — Other Ambulatory Visit: Payer: Self-pay

## 2024-06-10 ENCOUNTER — Emergency Department (HOSPITAL_BASED_OUTPATIENT_CLINIC_OR_DEPARTMENT_OTHER)
Admission: EM | Admit: 2024-06-10 | Discharge: 2024-06-10 | Disposition: A | Discharge status: Home / Self Care | Payer: Self-pay | Attending: Emergency Medicine | Admitting: Emergency Medicine

## 2024-06-10 DIAGNOSIS — R200 Anesthesia of skin: Secondary | ICD-10-CM | POA: Insufficient documentation

## 2024-06-10 LAB — BASIC METABOLIC PANEL WITH GFR
Anion gap: 12 (ref 5–15)
BUN: 11 mg/dL (ref 6–20)
CO2: 21 mmol/L — ABNORMAL LOW (ref 22–32)
Calcium: 9.5 mg/dL (ref 8.9–10.3)
Chloride: 103 mmol/L (ref 98–111)
Creatinine, Ser: 1.12 mg/dL (ref 0.61–1.24)
GFR, Estimated: >60 mL/min (ref >60)
Glucose, Bld: 95 mg/dL (ref 70–99)
Potassium: 4.3 mmol/L (ref 3.5–5.1)
Sodium: 136 mmol/L (ref 135–145)

## 2024-06-10 LAB — MAGNESIUM: Magnesium: 1.9 mg/dL (ref 1.7–2.4)

## 2024-06-10 LAB — CBC WITH DIFFERENTIAL/PLATELET
Abs Immature Granulocytes: 0.03 K/uL (ref 0.00–0.07)
Basophils Absolute: 0.1 K/uL (ref 0.0–0.1)
Basophils Relative: 1 %
Eosinophils Absolute: 0.1 K/uL (ref 0.0–0.5)
Eosinophils Relative: 1 %
HCT: 42.3 % (ref 39.0–52.0)
Hemoglobin: 14.7 g/dL (ref 13.0–17.0)
Immature Granulocytes: 0 %
Lymphocytes Relative: 15 %
Lymphs Abs: 1.4 K/uL (ref 0.7–4.0)
MCH: 31.6 pg (ref 26.0–34.0)
MCHC: 34.8 g/dL (ref 30.0–36.0)
MCV: 91 fL (ref 80.0–100.0)
Monocytes Absolute: 0.3 K/uL (ref 0.1–1.0)
Monocytes Relative: 3 %
Neutro Abs: 7.4 K/uL (ref 1.7–7.7)
Neutrophils Relative %: 80 %
Platelets: 267 K/uL (ref 150–400)
RBC: 4.65 MIL/uL (ref 4.22–5.81)
RDW: 13.2 % (ref 11.5–15.5)
WBC: 9.2 K/uL (ref 4.0–10.5)
nRBC: 0 % (ref 0.0–0.2)

## 2024-06-10 NOTE — ED Provider Notes (Addendum)
 [place] [facility] DEPARTMENT AT [address] [facility] Provider Note   CSN: [id number] Arrival date & time: 06/10/24  0800     Patient presents with: Numbness   Anthony Hooper is a 28 y.o. male.   Patient with acute onset bilateral leg numbness anterior posterior thigh on down the foot.  But no motor problems weakness.  It started about 130 this morning.  Denies any urinary or bowel problems no back complaint or neck complaint.  Just feels numb left greater than right.  Has not had this before.  Past medical history significant for migraine does not have a headache currently.  Patient does not have primary care doctor.       Prior to Admission medications   Medication Sig Start Date End Date Taking? Authorizing Provider  diphenhydrAMINE  (BENADRYL ) 25 MG tablet Take 1 tablet (25 mg total) by mouth every 4 (four) hours. Patient not taking: Reported on 12/20/2015 08/07/12   Harris, Abigail, PA-C  hydrocortisone  cream 1 % Apply to affected area every 4  Hours Patient not taking: Reported on 12/20/2015 08/07/12   Harris, Abigail, PA-C  ibuprofen (ADVIL,MOTRIN) 200 MG tablet Take 1,600 mg by mouth once.    [provider]  metroNIDAZOLE  (FLAGYL ) 500 MG tablet Take 1 tablet (500 mg total) by mouth 2 (two) times daily. Do not drink alcohol when taking this medication Patient not taking: Reported on 04/05/2024 02/22/22   Steinl, Kevin, MD  OVER THE COUNTER MEDICATION Take 0.5 Doses by mouth once. mucinex flu    [provider]    Allergies: Patient has no known allergies.    Review of Systems  Constitutional:  Negative for chills and fever.  HENT:  Negative for ear pain and sore throat.   Eyes:  Negative for pain and visual disturbance.  Respiratory:  Negative for cough and shortness of breath.   Cardiovascular:  Negative for chest pain and palpitations.  Gastrointestinal:  Negative for abdominal pain and vomiting.  Genitourinary:  Negative for dysuria and hematuria.   Musculoskeletal:  Negative for arthralgias, back pain, neck pain and neck stiffness.  Skin:  Negative for color change and rash.  Neurological:  Positive for numbness. Negative for dizziness, tremors, seizures, syncope, facial asymmetry, speech difficulty, weakness, light-headedness and headaches.  All other systems reviewed and are negative.   Updated Vital Signs BP 137/84   Pulse 84   Temp 98.8 F (37.1 C) (Oral)   Resp 15   Ht 1.829 m (6')   Wt 102.1 kg   SpO2 97%   BMI 30.52 kg/m   Physical Exam Vitals and nursing note reviewed.  Constitutional:      General: He is not in acute distress.    Appearance: Normal appearance. He is well-developed.  HENT:     Head: Normocephalic and atraumatic.  Eyes:     Conjunctiva/sclera: Conjunctivae normal.  Cardiovascular:     Rate and Rhythm: Normal rate and regular rhythm.     Heart sounds: No murmur heard. Pulmonary:     Effort: Pulmonary effort is normal. No respiratory distress.     Breath sounds: Normal breath sounds.  Abdominal:     Palpations: Abdomen is soft.     Tenderness: There is no abdominal tenderness.  Musculoskeletal:        General: No swelling.     Cervical back: Neck supple.  Skin:    General: Skin is warm and dry.     Capillary Refill: Capillary refill takes less than 2 seconds.  Neurological:     Mental Status: He is alert.     Cranial Nerves: No cranial nerve deficit.     Sensory: Sensory deficit present.     Motor: No weakness.  Psychiatric:        Mood and Affect: Mood normal.     (all labs ordered are listed, but only abnormal results are displayed) Labs Reviewed  CBC WITH DIFFERENTIAL/PLATELET  BASIC METABOLIC PANEL WITH GFR  MAGNESIUM    EKG: None  Radiology: No results found.   Procedures   Medications Ordered in the ED - No data to display                                  Medical Decision Making Amount and/or Complexity of Data Reviewed Labs: ordered. Radiology:  ordered.   Neuroexam without any weakness.  But subjective sensory numbness to left greater than right lower extremity.  No upper extremity weakness no visual changes.  No facial asymmetry.  No headache.  Could be back related.  Certainly no incontinence associated with it and without any motor weakness do not feel he needs an acute MRI.  But obviously if symptoms persist over the next 2 weeks.  May require outpatient MRI.  Today will go ahead and just look at head CT and get basic labs.  This is acute onset at 1:30 in the morning.  Based on his age stroke unlikely.  MS is a possibility but again thinks just started today.  Could see if things resolve over the next few days.  No neck pain no back pain.  Head CT and basic labs without any acute findings.  Will refer patient to wellness clinic for follow-up and neurosurgery.  Patient given precautions that if he develops any incontinence or lower extremity weakness he needs to get seen right away.  And will need MRI.  Final diagnoses:  Lower extremity numbness    ED Discharge Orders     None          Geraldene Hamilton, MD 06/10/24 9084    Geraldene Hamilton, MD 06/10/24 9083    Geraldene Hamilton, MD 06/10/24 1057

## 2024-06-10 NOTE — ED Notes (Signed)

## 2024-06-10 NOTE — Discharge Instructions (Signed)
 Return immediately for MRI if you develop incontinence peeing on yourself.  Or any bilateral leg weakness.  Otherwise make an appointment to follow-up with neurosurgery.  Also make an appointment follow-up with the wellness clinic.  Work note provided.  Rest is much as possible.  Return for any new or worse symptoms

## 2024-06-10 NOTE — ED Triage Notes (Signed)
 Numbness/pins and needles feeling in bilateral mid thigh traveling to bilateral feet onset at approx 0130 this am. Denies injury. Has not been the gyn in 1 week. Denies urinary or bowel problems. Motor and gross sensation intact.
